# Patient Record
Sex: Female | Born: 1995 | Race: Black or African American | Hispanic: No | Marital: Married | Smoking: Never smoker
Health system: Southern US, Community
[De-identification: ages and names within clinical notes are randomized; demographics above are authoritative.]

---

## 2016-07-15 ENCOUNTER — Ambulatory Visit (INDEPENDENT_AMBULATORY_CARE_PROVIDER_SITE_OTHER): Payer: No Typology Code available for payment source | Admitting: Family Medicine

## 2016-07-15 VITALS — BP 122/72 | HR 71 | Temp 98.8°F | Resp 17 | Ht 67.0 in | Wt 152.0 lb

## 2016-07-15 DIAGNOSIS — R059 Cough, unspecified: Secondary | ICD-10-CM

## 2016-07-15 DIAGNOSIS — R05 Cough: Secondary | ICD-10-CM

## 2016-07-15 MED ORDER — GUAIFENESIN ER 1200 MG PO TB12: 1.0000 | ORAL_TABLET | Freq: Two times a day (BID) | ORAL | 1 refills | Status: DC | PRN

## 2016-07-15 MED ORDER — BENZONATATE 100 MG PO CAPS: 100.0000 mg | ORAL_CAPSULE | Freq: Three times a day (TID) | ORAL | 0 refills | Status: DC | PRN

## 2016-07-15 NOTE — Progress Notes (Signed)
   Patient ID: Katrina Mcdonald, female    DOB: 10-24-1995, 20 y.o.   MRN: [id number]  PCP: No primary care provider on file.  Chief Complaint  Patient presents with  . URI  . Headache    Subjective:   HPI 20 year old female presents for evaluation of headache and upper respiratory symptoms times 2.5 weeks. [facility] is [facility]. Reports 2.5 weeks ago she felt really ill and developed a sore throat, cough, nasal congestion, fever, and headache.  Reports almost complete resolution of symptoms until 2 days ago she began feeling fatigue and began coughing with some chest tenderness. She hasn't taken any medication and feels her cough is most worrisome symptom. Denies any recent fever, nausea, or vomiting.  Social History   Social History  . Marital status: Married    Spouse name: N/A  . Number of children: N/A  . Years of education: N/A   Occupational History  . Not on file.   Social History Main Topics  . Smoking status: Never Smoker  . Smokeless tobacco: Never Used  . Alcohol use No  . Drug use: No  . Sexual activity: No   Other Topics Concern  . Not on file   Social History Narrative  . No narrative on file   No family history on file.  Review of Systems See HPI There are no active problems to display for this patient.  Prior to Admission medications   Not on File   Allergies not on file     Objective:  Physical Exam  Constitutional: She is oriented to person, place, and time. She appears well-developed and well-nourished.  HENT:  Head: Normocephalic and atraumatic.  Right Ear: External ear normal.  Left Ear: External ear normal.  Nose: Mucosal edema and rhinorrhea present.  Mouth/Throat: Oropharynx is clear and moist.  Cardiovascular: Normal rate, regular rhythm, normal heart sounds and intact distal pulses.   Pulmonary/Chest: Effort normal and breath sounds normal.  Neurological: She is alert and oriented to person, place, and time.  Skin: Skin is warm and dry.    Psychiatric: She has a normal mood and affect. Her behavior is normal. Judgment and thought content normal.     Vitals:   07/15/16 1505  BP: 122/72  Pulse: 71  Resp: 17  Temp: 98.8 F (37.1 C)      Assessment & Plan:  Cough Plan: Start Guaifenesin (Mucinex) 1200 mg twice daily as needed to thin secretions. Start  Benzonatate (Tessalon) 100 mg, up to 3 times daily as needed for cough.  Will consider antibiotic treatment if symptoms worsen or do not improve. Patient instructed to update if condition worsens.  Godfrey PickKimberly S. Tiburcio PeaHarris, MSN, FNP-C [facility]

## 2016-07-15 NOTE — Patient Instructions (Signed)
     IF you received an x-ray today, you will receive an invoice from [facility]. Please contact [facility] at [phone] with questions or concerns regarding your invoice.   IF you received labwork today, you will receive an invoice from [facility]. Please contact [facility] at [phone] with questions or concerns regarding your invoice.   Our billing staff will not be able to assist you with questions regarding bills from these companies.  You will be contacted with the lab results as soon as they are available. The fastest way to get your results is to activate your My Chart account. Instructions are located on the last page of this paperwork. If you have not heard from us regarding the results in 2 weeks, please contact this office.      

## 2018-09-28 ENCOUNTER — Other Ambulatory Visit: Payer: Self-pay | Admitting: Obstetrics and Gynecology

## 2018-10-03 ENCOUNTER — Other Ambulatory Visit: Payer: Self-pay | Admitting: Obstetrics and Gynecology

## 2018-10-03 DIAGNOSIS — N63 Unspecified lump in unspecified breast: Secondary | ICD-10-CM

## 2018-10-09 ENCOUNTER — Ambulatory Visit
Admission: RE | Admit: 2018-10-09 | Discharge: 2018-10-09 | Disposition: A | Discharge status: Home / Self Care | Payer: PRIVATE HEALTH INSURANCE | Source: Ambulatory Visit | Attending: Obstetrics and Gynecology | Admitting: Obstetrics and Gynecology

## 2018-10-09 ENCOUNTER — Other Ambulatory Visit: Payer: Self-pay | Admitting: Obstetrics and Gynecology

## 2018-10-09 DIAGNOSIS — N63 Unspecified lump in unspecified breast: Secondary | ICD-10-CM

## 2018-10-10 ENCOUNTER — Other Ambulatory Visit: Payer: Self-pay | Admitting: Obstetrics and Gynecology

## 2018-10-10 DIAGNOSIS — Z803 Family history of malignant neoplasm of breast: Secondary | ICD-10-CM

## 2018-10-16 ENCOUNTER — Ambulatory Visit
Admission: RE | Admit: 2018-10-16 | Discharge: 2018-10-16 | Disposition: A | Discharge status: Home / Self Care | Payer: PRIVATE HEALTH INSURANCE | Source: Ambulatory Visit | Attending: Obstetrics and Gynecology | Admitting: Obstetrics and Gynecology

## 2018-10-16 DIAGNOSIS — Z803 Family history of malignant neoplasm of breast: Secondary | ICD-10-CM

## 2019-04-10 ENCOUNTER — Other Ambulatory Visit: Payer: Self-pay | Admitting: Obstetrics and Gynecology

## 2019-04-10 ENCOUNTER — Ambulatory Visit
Admission: RE | Admit: 2019-04-10 | Discharge: 2019-04-10 | Disposition: A | Discharge status: Home / Self Care | Payer: PRIVATE HEALTH INSURANCE | Source: Ambulatory Visit | Attending: Obstetrics and Gynecology | Admitting: Obstetrics and Gynecology

## 2019-04-10 ENCOUNTER — Other Ambulatory Visit: Payer: Self-pay

## 2019-04-10 DIAGNOSIS — N63 Unspecified lump in unspecified breast: Secondary | ICD-10-CM

## 2019-05-30 ENCOUNTER — Ambulatory Visit
Admission: EM | Admit: 2019-05-30 | Discharge: 2019-05-30 | Disposition: A | Discharge status: Home / Self Care | Payer: 59 | Attending: Emergency Medicine | Admitting: Emergency Medicine

## 2019-05-30 ENCOUNTER — Other Ambulatory Visit: Payer: Self-pay

## 2019-05-30 DIAGNOSIS — J029 Acute pharyngitis, unspecified: Secondary | ICD-10-CM | POA: Insufficient documentation

## 2019-05-30 DIAGNOSIS — Z1159 Encounter for screening for other viral diseases: Secondary | ICD-10-CM | POA: Diagnosis not present

## 2019-05-30 DIAGNOSIS — R509 Fever, unspecified: Secondary | ICD-10-CM | POA: Diagnosis not present

## 2019-05-30 LAB — POCT RAPID STREP A (OFFICE): Rapid Strep A Screen: NEGATIVE

## 2019-05-30 MED ORDER — LIDOCAINE VISCOUS HCL 2 % MT SOLN: 15.0000 mL | OROMUCOSAL | 0 refills | Status: DC | PRN

## 2019-05-30 MED ORDER — ACETAMINOPHEN 325 MG PO TABS
650.0000 mg | ORAL_TABLET | Freq: Once | ORAL | Status: AC
  Administered 2019-05-30: 650 mg via ORAL

## 2019-05-30 MED ORDER — PENICILLIN V POTASSIUM 500 MG PO TABS: 500.0000 mg | ORAL_TABLET | Freq: Two times a day (BID) | ORAL | 0 refills | Status: AC

## 2019-05-30 NOTE — Discharge Instructions (Addendum)
Take antibiotics as prescribed. You may gargle/spit lidocaine for additional throat relief. Return for worsening throat pain, chest pain, fever, shortness of breath.  Your COVID test is pending: Is important you quarantine at home until your results are back. You may take OTC Tylenol for fever and myalgias.  It is important to drink plenty of water throughout the day to stay hydrated. If you test positive and later develop severe fever, cough, or shortness of breath, it is recommended that you go to the ER for further evaluation.

## 2019-05-30 NOTE — ED Provider Notes (Signed)
[facility]    CSN: [id number] Arrival date & time: 05/30/19  0840      History   Chief Complaint Chief Complaint  Patient presents with  . Sore Throat    HPI Katrina Mcdonald is a 23 y.o. female presenting for sore throat, dyne aphasia, dysphasia since yesterday.  Patient also endorsing fever, tonsillar hypertrophy, exudate.  Patient denies known sick contacts, cough, shortness of breath, chest pain.  Patient has been able to work from home.   History reviewed. No pertinent past medical history.  There are no active problems to display for this patient.   History reviewed. No pertinent surgical history.  OB History   No obstetric history on file.      Home Medications    Prior to Admission medications   Medication Sig Start Date End Date Taking? Authorizing Provider  Multiple Vitamin (MULTIVITAMIN WITH MINERALS) TABS tablet Take 1 tablet by mouth daily.   Yes [provider]  lidocaine (XYLOCAINE) 2 % solution Use as directed 15 mLs in the mouth or throat as needed for mouth pain. 05/30/19   Hall-Potvin, [place], PA-C  penicillin v potassium (VEETID) 500 MG tablet Take 1 tablet (500 mg total) by mouth 2 (two) times daily after a meal for 10 days. 05/30/19 06/09/19  Hall-Potvin, [place], PA-C    Family History Family History  Problem Relation Age of Onset  . Breast cancer Mother   . Breast cancer Paternal Grandmother     Social History Social History   Tobacco Use  . Smoking status: Never Smoker  . Smokeless tobacco: Never Used  Substance Use Topics  . Alcohol use: Yes    Comment: occasionally  . Drug use: No     Allergies   Patient has no known allergies.   Review of Systems Review of Systems  Constitutional: Positive for fever. Negative for fatigue.  HENT: Positive for sore throat and trouble swallowing. Negative for ear pain, sinus pain and voice change.   Eyes: Negative for pain, redness and visual disturbance.   Respiratory: Negative for cough and shortness of breath.   Cardiovascular: Negative for chest pain and palpitations.  Gastrointestinal: Negative for abdominal pain, diarrhea and vomiting.  Musculoskeletal: Negative for arthralgias and myalgias.  Skin: Negative for rash and wound.  Neurological: Negative for syncope and headaches.     Physical Exam Triage Vital Signs ED Triage Vitals  Enc Vitals Group     BP 05/30/19 0848 116/73     Pulse Rate 05/30/19 0848 (!) 117     Resp 05/30/19 0848 16     Temp 05/30/19 0848 (!) 102.1 F (38.9 C)     Temp Source 05/30/19 0848 Oral     SpO2 05/30/19 0848 97 %     Weight --      Height --      Head Circumference --      Peak Flow --      Pain Score 05/30/19 0851 10     Pain Loc --      Pain Edu? --      Excl. in [place]? --    No data found.  Updated Vital Signs BP 116/73 (BP Location: Left Arm)   Pulse (!) 117   Temp (!) 102.1 F (38.9 C) (Oral)   Resp 16   LMP 05/28/2019   SpO2 97%    Physical Exam Constitutional:      General: She is not in acute distress.    Appearance: She  is well-developed and normal weight. She is not ill-appearing.  HENT:     Head: Normocephalic and atraumatic.     Jaw: There is normal jaw occlusion. No tenderness or pain on movement.     Right Ear: Hearing, tympanic membrane, ear canal and external ear normal. No tenderness. No mastoid tenderness.     Left Ear: Hearing, tympanic membrane, ear canal and external ear normal. No tenderness. No mastoid tenderness.     Nose: No nasal deformity, septal deviation or nasal tenderness.     Right Turbinates: Not swollen or pale.     Left Turbinates: Not swollen or pale.     Right Sinus: No maxillary sinus tenderness or frontal sinus tenderness.     Left Sinus: No maxillary sinus tenderness or frontal sinus tenderness.     Mouth/Throat:     Lips: Pink. No lesions.     Mouth: Mucous membranes are moist. No injury.     Pharynx: Oropharynx is clear. Uvula midline.  No posterior oropharyngeal erythema or uvula swelling.     Tonsils: Tonsillar exudate present. No tonsillar abscesses. 3+ on the right. 2+ on the left.  Eyes:     General: No scleral icterus.    Conjunctiva/sclera: Conjunctivae normal.     Pupils: Pupils are equal, round, and reactive to light.  Neck:     Musculoskeletal: Normal range of motion and neck supple. No muscular tenderness.     Comments: Bilateral anterior cervical chain LAD that is tender Cardiovascular:     Rate and Rhythm: Regular rhythm. Tachycardia present.     Heart sounds: No murmur. No gallop.      Comments: HR per provider: 107 bpm Pulmonary:     Effort: Pulmonary effort is normal. No respiratory distress.     Breath sounds: No wheezing or rales.  Lymphadenopathy:     Cervical: Cervical adenopathy present.  Skin:    Capillary Refill: Capillary refill takes less than 2 seconds.     Coloration: Skin is not jaundiced.     Findings: No erythema or rash.  Neurological:     General: No focal deficit present.     Mental Status: She is alert and oriented to person, place, and time.  Psychiatric:        Mood and Affect: Mood normal.        Behavior: Behavior normal.      UC Treatments / Results  Labs (all labs ordered are listed, but only abnormal results are displayed) Labs Reviewed  NOVEL CORONAVIRUS, NAA  CULTURE, GROUP A STREP [facility])  POCT RAPID STREP A (OFFICE)    EKG   Radiology No results found.  Procedures Procedures (including critical care time)  Medications Ordered in UC Medications  acetaminophen (TYLENOL) tablet 650 mg (650 mg Oral Given 05/30/19 0908)    Initial Impression / Assessment and Plan / UC Course  I have reviewed the triage vital signs and the nursing notes.  Pertinent labs & imaging results that were available during my care of the patient were reviewed by me and considered in my medical decision making (see chart for details).     1.  Sore throat Rapid strep done in  office, reviewed by me: Negative.  Culture pending.  Centor score 4.  Due to patient's fever, tachycardia, tonsillar exudate and hypertrophy will treat empirically for streptococcal infection/tonsillitis with penicillin.  Return precautions discussed, patient verbalized understanding and is agreeable to plan. Final Clinical Impressions(s) / UC Diagnoses   Final diagnoses:  Sore throat     Discharge Instructions     Take antibiotics as prescribed. You may gargle/spit lidocaine for additional throat relief. Return for worsening throat pain, chest pain, fever, shortness of breath.  Your COVID test is pending: Is important you quarantine at home until your results are back. You may take OTC Tylenol for fever and myalgias.  It is important to drink plenty of water throughout the day to stay hydrated. If you test positive and later develop severe fever, cough, or shortness of breath, it is recommended that you go to the ER for further evaluation.    ED Prescriptions    Medication Sig Dispense Auth. Provider   lidocaine (XYLOCAINE) 2 % solution Use as directed 15 mLs in the mouth or throat as needed for mouth pain. 100 mL Hall-Potvin, [place], PA-C   penicillin v potassium (VEETID) 500 MG tablet Take 1 tablet (500 mg total) by mouth 2 (two) times daily after a meal for 10 days. 20 tablet Hall-Potvin, [place], PA-C     Controlled Substance Prescriptions [place] Controlled Substance Registry consulted? Not Applicable   Shea EvansHall-Potvin, , [place]-C 05/30/19 1019

## 2019-05-30 NOTE — ED Triage Notes (Signed)
Pt presents to UC w/ c/o sore throat and fever since yesterday. Pt reports pain when swallowing. Pt has white patches and redness on tonsils.

## 2019-06-01 LAB — CULTURE, GROUP A STREP (THRC)

## 2019-06-01 LAB — NOVEL CORONAVIRUS, NAA: SARS-CoV-2, NAA: NOT DETECTED

## 2019-06-10 ENCOUNTER — Telehealth: Payer: 59 | Admitting: Physician Assistant

## 2019-06-10 DIAGNOSIS — J029 Acute pharyngitis, unspecified: Secondary | ICD-10-CM | POA: Diagnosis not present

## 2019-06-10 MED ORDER — AMOXICILLIN 500 MG PO CAPS: 500.0000 mg | ORAL_CAPSULE | Freq: Two times a day (BID) | ORAL | 0 refills | Status: DC

## 2019-06-10 NOTE — Progress Notes (Signed)
We are sorry that you are not feeling well.  Here is how we plan to help!  Based on what you have shared with me it is likely that you have strep pharyngitis.  Strep pharyngitis is inflammation and infection in the back of the throat.  This is an infection cause by bacteria and is treated with antibiotics.  I have prescribed Amoxicillin 500 mg twice a day for 10 days. For throat pain, we recommend over the counter oral pain relief medications such as acetaminophen or aspirin, or anti-inflammatory medications such as ibuprofen or naproxen sodium. Topical treatments such as oral throat lozenges or sprays may be used as needed. Strep infections are not as easily transmitted as other respiratory infections, however we still recommend that you avoid close contact with loved ones, especially the very [place] and elderly.  Remember to wash your hands thoroughly throughout the day as this is the number one way to prevent the spread of infection and wipe down door knobs and counters with disinfectant.   Home Care:  Only take medications as instructed by your medical team.  Complete the entire course of an antibiotic.  Do not take these medications with alcohol.  A steam or ultrasonic humidifier can help congestion.  You can place a towel over your head and breathe in the steam from hot water coming from a faucet.  Avoid close contacts especially the very [place] and the elderly.  Cover your mouth when you cough or sneeze.  Always remember to wash your hands.  Get Help Right Away If:  You develop worsening fever or sinus pain.  You develop a severe head ache or visual changes.  Your symptoms persist after you have completed your treatment plan.  Make sure you  Understand these instructions.  Will watch your condition.  Will get help right away if you are not doing well or get worse.  Your e-visit answers were reviewed by a board certified advanced clinical practitioner to complete your  personal care plan.  Depending on the condition, your plan could have included both over the counter or prescription medications.  If there is a problem please reply  once you have received a response from your provider.  Your safety is important to us.  If you have drug allergies check your prescription carefully.    You can use MyChart to ask questions about today's visit, request a non-urgent call back, or ask for a work or school excuse for 24 hours related to this e-Visit. If it has been greater than 24 hours you will need to follow up with your provider, or enter a new e-Visit to address those concerns.  You will get an e-mail in the next two days asking about your experience.  I hope that your e-visit has been valuable and will speed your recovery. Thank you for using e-visits.  Greater than 5 minutes, yet less than 10 minutes of time have been spent researching, coordinating and implementing care for this patient today.   

## 2019-06-23 ENCOUNTER — Other Ambulatory Visit: Payer: Self-pay

## 2019-06-23 ENCOUNTER — Ambulatory Visit
Admission: EM | Admit: 2019-06-23 | Discharge: 2019-06-23 | Disposition: A | Discharge status: Home / Self Care | Payer: PRIVATE HEALTH INSURANCE | Attending: Emergency Medicine | Admitting: Emergency Medicine

## 2019-06-23 DIAGNOSIS — J3501 Chronic tonsillitis: Secondary | ICD-10-CM

## 2019-06-23 MED ORDER — AMOXICILLIN 500 MG PO CAPS: 500.0000 mg | ORAL_CAPSULE | Freq: Two times a day (BID) | ORAL | 0 refills | Status: AC

## 2019-06-23 NOTE — ED Triage Notes (Signed)
Pt c/o recurrent sore throat for past few weeks. States has been on 2 rounds of antibotics. States sore throat with white patches since yesterday

## 2019-06-23 NOTE — ED Provider Notes (Signed)
[facility]    CSN: [id number] Arrival date & time: 06/23/19  0254      History   Chief Complaint Chief Complaint  Patient presents with  . Sore Throat    HPI Katrina Mcdonald is a 23 y.o. female presenting for 2-day course of sore throat.  Patient presents seen on 9/17 by me: Given penicillin for positive strep test with sore throat and fever.  Patient states she also did an E visit a few days after completing that course as "it came right back ".  Patient was given amoxicillin twice daily x10 days, which she took as prescribed.  Patient states about 2 days after finishing it she developed symptoms again.  Patient endorsing enlarged tonsils, exudate, odynophagia.  Patient not currently sexually active.   History reviewed. No pertinent past medical history.  There are no active problems to display for this patient.   History reviewed. No pertinent surgical history.  OB History   No obstetric history on file.      Home Medications    Prior to Admission medications   Medication Sig Start Date End Date Taking? Authorizing Provider  amoxicillin (AMOXIL) 500 MG capsule Take 1 capsule (500 mg total) by mouth 2 (two) times daily for 10 days. 06/23/19 07/03/19  Hall-Potvin, [place], PA-C  Multiple Vitamin (MULTIVITAMIN WITH MINERALS) TABS tablet Take 1 tablet by mouth daily.    [provider]    Family History Family History  Problem Relation Age of Onset  . Breast cancer Mother   . Breast cancer Paternal Grandmother     Social History Social History   Tobacco Use  . Smoking status: Never Smoker  . Smokeless tobacco: Never Used  Substance Use Topics  . Alcohol use: Yes    Comment: occasionally  . Drug use: No     Allergies   Patient has no known allergies.   Review of Systems Review of Systems  Constitutional: Negative for fatigue and fever.  HENT: Positive for sore throat and trouble swallowing. Negative for congestion, dental  problem, ear pain, facial swelling, hearing loss, sinus pain and voice change.   Eyes: Negative for photophobia, pain and visual disturbance.  Respiratory: Negative for cough and shortness of breath.   Cardiovascular: Negative for chest pain and palpitations.  Gastrointestinal: Negative for diarrhea and vomiting.  Musculoskeletal: Negative for arthralgias and myalgias.  Neurological: Negative for dizziness and headaches.     Physical Exam Triage Vital Signs ED Triage Vitals  Enc Vitals Group     BP      Pulse      Resp      Temp      Temp src      SpO2      Weight      Height      Head Circumference      Peak Flow      Pain Score      Pain Loc      Pain Edu?      Excl. in [place]?    No data found.  Updated Vital Signs BP 122/79 (BP Location: Left Arm)   Pulse (!) 106   Temp 98.4 F (36.9 C) (Oral)   Resp 18   LMP 05/28/2019   SpO2 97%   Visual Acuity Right Eye Distance:   Left Eye Distance:   Bilateral Distance:    Right Eye Near:   Left Eye Near:    Bilateral Near:     Physical  Exam Constitutional:      General: She is not in acute distress.    Appearance: She is normal weight. She is not ill-appearing.  HENT:     Head: Normocephalic and atraumatic.     Jaw: There is normal jaw occlusion. No tenderness or pain on movement.     Right Ear: Hearing, tympanic membrane, ear canal and external ear normal. No tenderness. No mastoid tenderness.     Left Ear: Hearing, tympanic membrane, ear canal and external ear normal. No tenderness. No mastoid tenderness.     Nose: No nasal deformity, septal deviation or nasal tenderness.     Right Turbinates: Not swollen or pale.     Left Turbinates: Not swollen or pale.     Right Sinus: No maxillary sinus tenderness or frontal sinus tenderness.     Left Sinus: No maxillary sinus tenderness or frontal sinus tenderness.     Mouth/Throat:     Lips: Pink. No lesions.     Mouth: Mucous membranes are moist. No injury.     Pharynx:  Oropharynx is clear. Uvula midline. No posterior oropharyngeal erythema or uvula swelling.     Tonsils: Tonsillar exudate present. No tonsillar abscesses. 3+ on the right. 3+ on the left.  Eyes:     Conjunctiva/sclera: Conjunctivae normal.  Neck:     Musculoskeletal: Normal range of motion and neck supple. No muscular tenderness.  Cardiovascular:     Rate and Rhythm: Regular rhythm. Tachycardia present.     Heart sounds: No murmur. No gallop.      Comments: Patient's heart rate between 95-107 bpm during time visit Pulmonary:     Effort: Pulmonary effort is normal. No respiratory distress.     Breath sounds: No wheezing.  Lymphadenopathy:     Cervical: Cervical adenopathy present.  Skin:    General: Skin is warm.     Capillary Refill: Capillary refill takes less than 2 seconds.     Findings: No erythema or rash.  Neurological:     General: No focal deficit present.     Mental Status: She is alert and oriented to person, place, and time.      UC Treatments / Results  Labs (all labs ordered are listed, but only abnormal results are displayed) Labs Reviewed - No data to display  EKG   Radiology No results found.  Procedures Procedures (including critical care time)  Medications Ordered in UC Medications - No data to display  Initial Impression / Assessment and Plan / UC Course  I have reviewed the triage vital signs and the nursing notes.  Pertinent labs & imaging results that were available during my care of the patient were reviewed by me and considered in my medical decision making (see chart for details).     1.  Chronic tonsillitis Patient now being seen for third time within 1 month for tonsillar hypertrophy, exudate, odynophagia.  Patient has completed 2 courses of antibiotics.  Discussed that patient should be further evaluated by ENT for additional work-up/possible tonsillectomy.  Patient verbalized understanding.  Given previous positive strep done in this  office, with previous evaluation by this provider, prescription for amoxicillin was sent.  Discussed the patient should fill this only if she becomes febrile due to chronic antibiotic use, with expected short-term follow-up by ENT.  Return precautions discussed, patient verbalized understanding and is agreeable to plan. Final Clinical Impressions(s) / UC Diagnoses   Final diagnoses:  Chronic tonsillitis     Discharge Instructions  You may start taking amoxicillin if you develop a fever. Important to call ENT on Monday/Tuesday when they open to schedule appointment for 1 week urgent care follow-up. Return for worsening pain, difficulty breathing, fever that does not reduce with Tylenol.    ED Prescriptions    Medication Sig Dispense Auth. Provider   amoxicillin (AMOXIL) 500 MG capsule Take 1 capsule (500 mg total) by mouth 2 (two) times daily for 10 days. 20 capsule Hall-Potvin, [place], PA-C     PDMP not reviewed this encounter.   Hall-Potvin, [place], [place] 06/23/19 1308

## 2019-06-23 NOTE — Discharge Instructions (Addendum)
You may start taking amoxicillin if you develop a fever. Important to call ENT on Monday/Tuesday when they open to schedule appointment for 1 week urgent care follow-up. Return for worsening pain, difficulty breathing, fever that does not reduce with Tylenol.

## 2019-06-24 ENCOUNTER — Telehealth: Payer: Self-pay | Admitting: Emergency Medicine

## 2019-06-24 NOTE — Telephone Encounter (Signed)
Checked in on patient, discussed medications, and encouraged return call with any continuing questions or concerns.    

## 2019-10-14 ENCOUNTER — Ambulatory Visit
Admission: RE | Admit: 2019-10-14 | Discharge: 2019-10-14 | Disposition: A | Discharge status: Home / Self Care | Payer: PRIVATE HEALTH INSURANCE | Source: Ambulatory Visit | Attending: Obstetrics and Gynecology | Admitting: Obstetrics and Gynecology

## 2019-10-14 ENCOUNTER — Other Ambulatory Visit: Payer: Self-pay

## 2019-10-14 ENCOUNTER — Ambulatory Visit
Admission: RE | Admit: 2019-10-14 | Discharge: 2019-10-14 | Disposition: A | Discharge status: Home / Self Care | Payer: 59 | Source: Ambulatory Visit | Attending: Obstetrics and Gynecology | Admitting: Obstetrics and Gynecology

## 2019-10-14 DIAGNOSIS — N63 Unspecified lump in unspecified breast: Secondary | ICD-10-CM

## 2020-09-03 IMAGING — MG DIGITAL DIAGNOSTIC BILAT W/ TOMO W/ CAD
8 series · 8 of 24 positions shown · non-contrast
Comparison: Previous exam(s).

CLINICAL DATA: Short-term interval follow-up of a probable benign
left breast mass and axillary lymph node originally seen on
ultrasound dated 10/09/2018. Strong family history of breast cancer.
The patient's mother was diagnosed with breast cancer at the age of
24.

EXAM:
DIGITAL DIAGNOSTIC BILATERAL MAMMOGRAM WITH CAD AND TOMO
ULTRASOUND LEFT BREAST

[L MLO synth-2D]
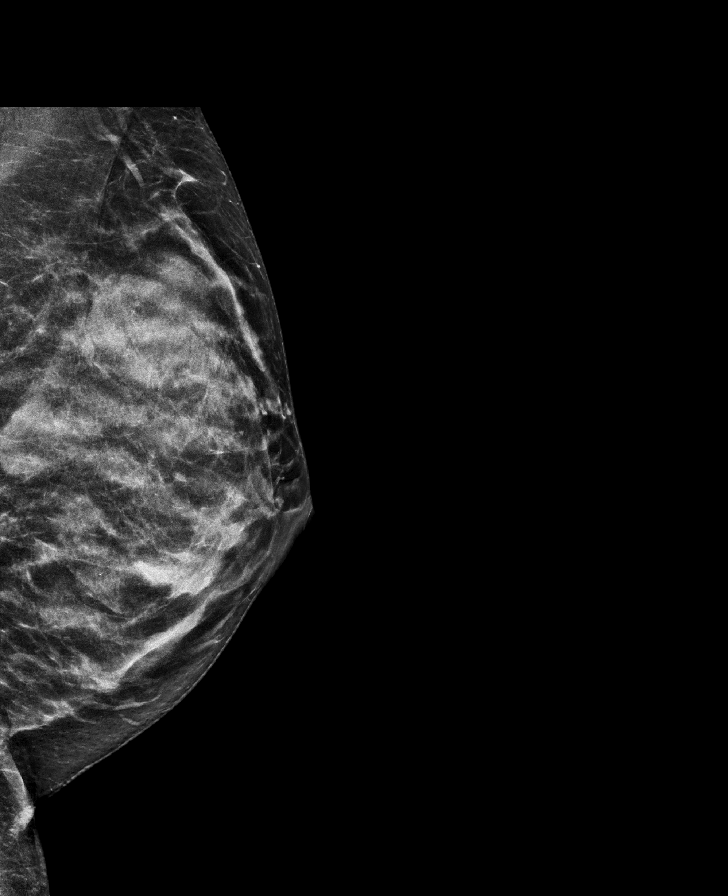

[R MLO synth-2D]
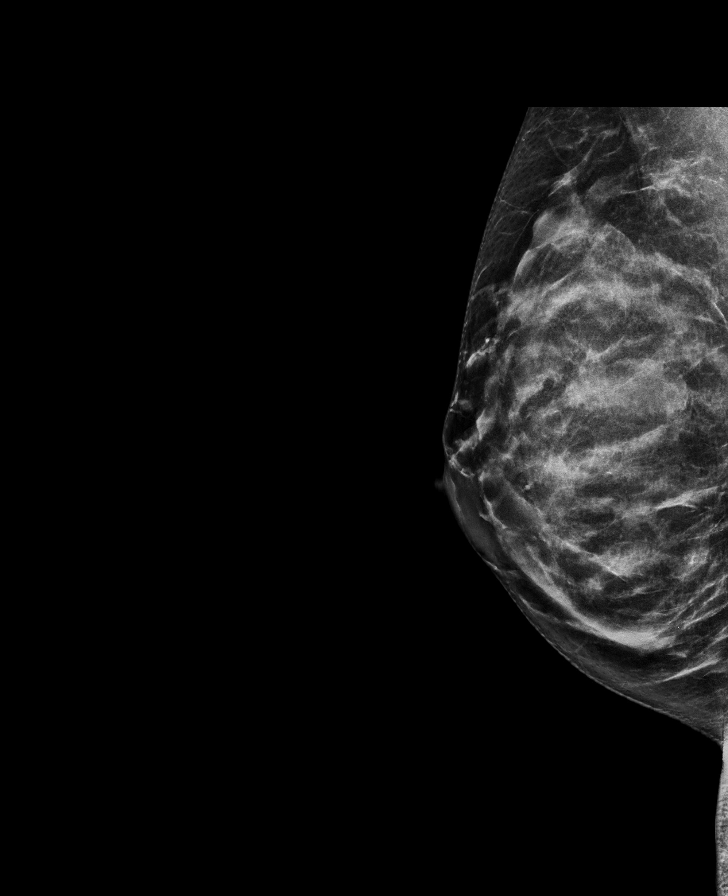

[L CC synth-2D]
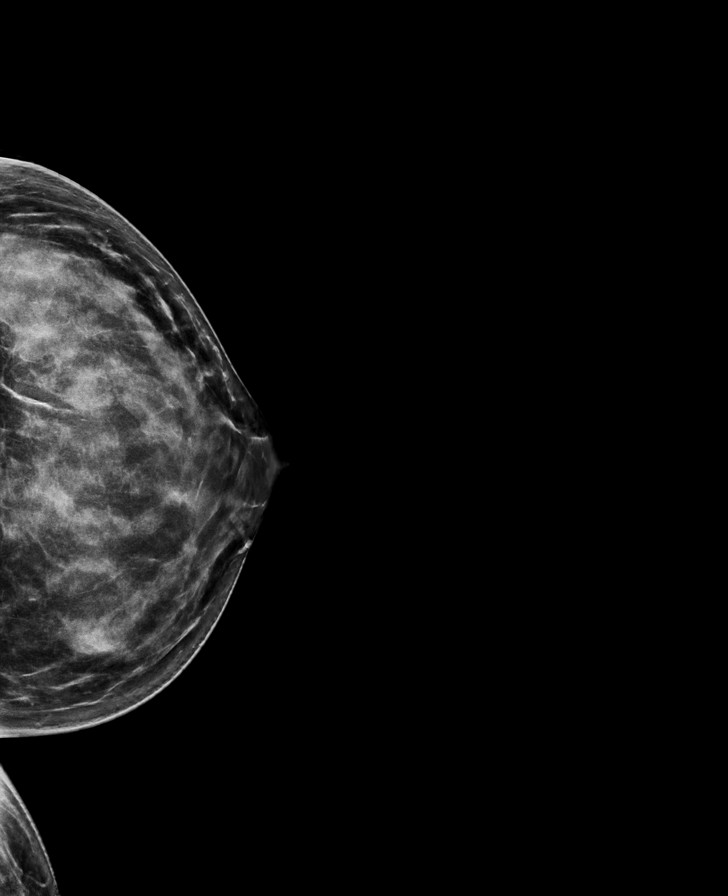

[R CC synth-2D]
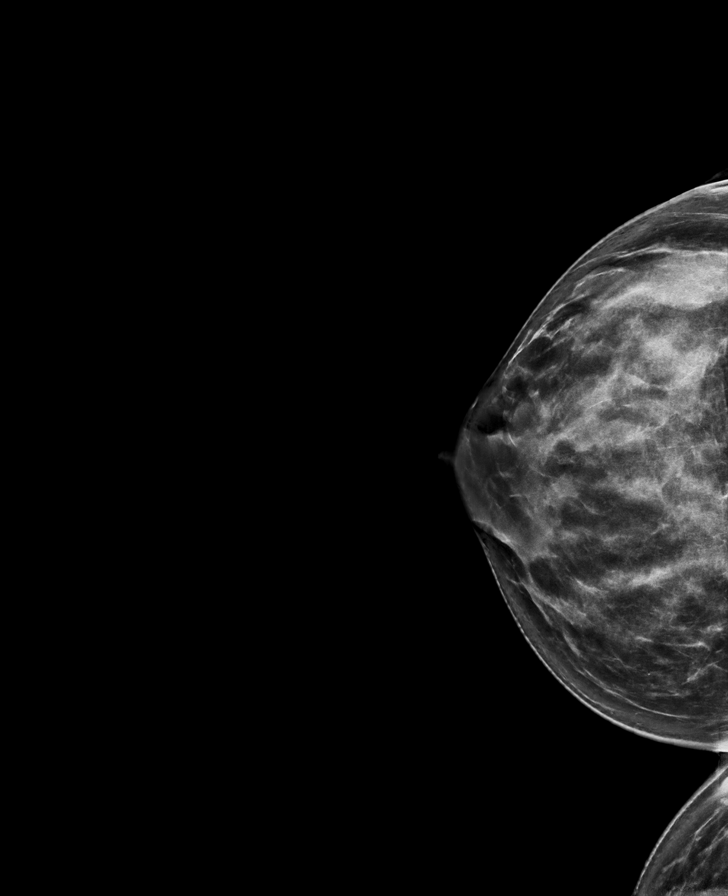

[R CC tomo · tomo slice 39/78.0]
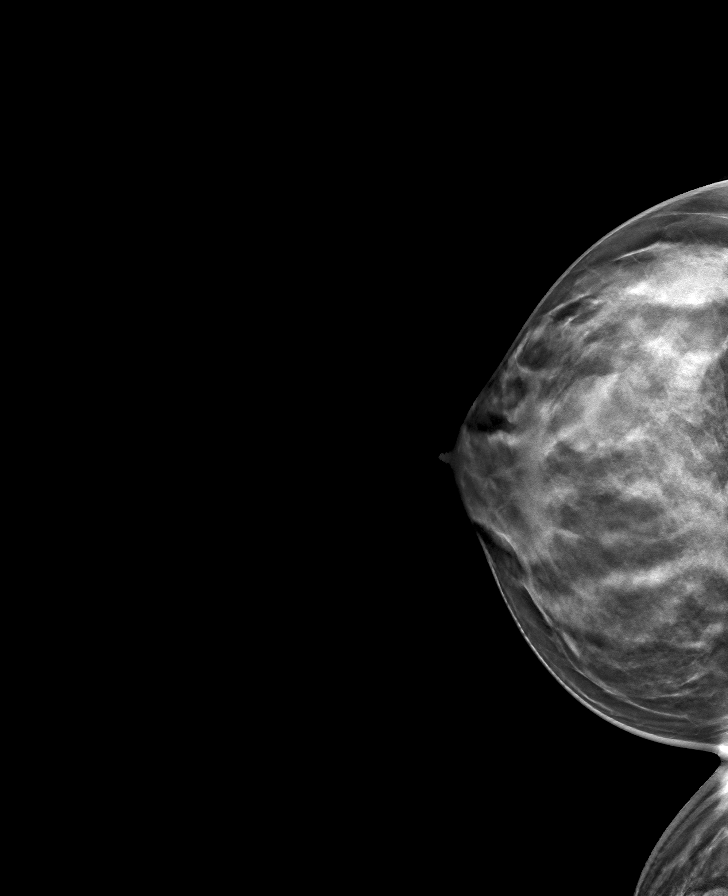

[R MLO tomo · tomo slice 34/67.0]
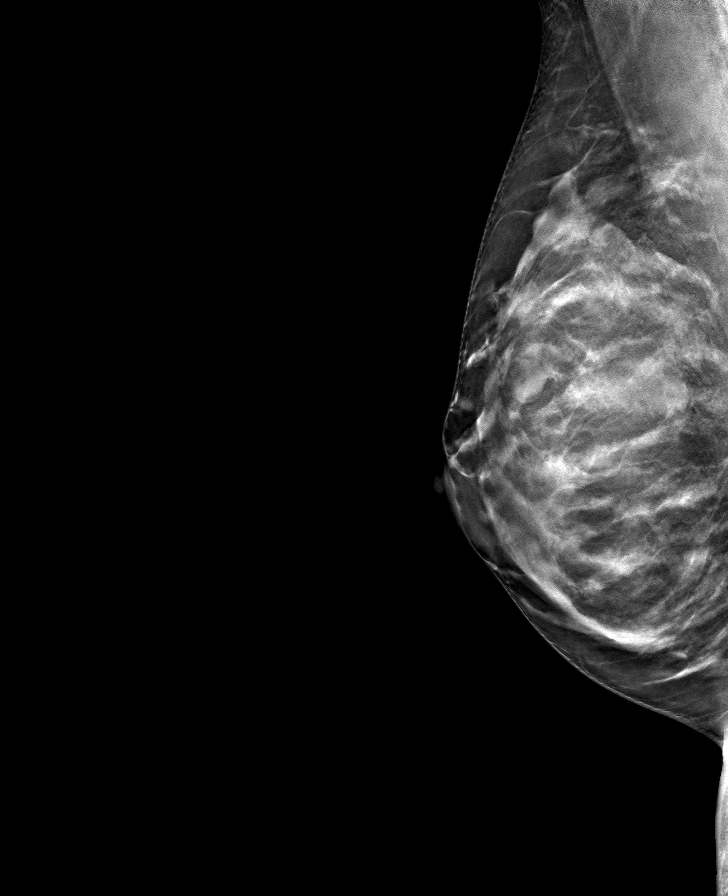

[L CC tomo · tomo slice 37/73.0]
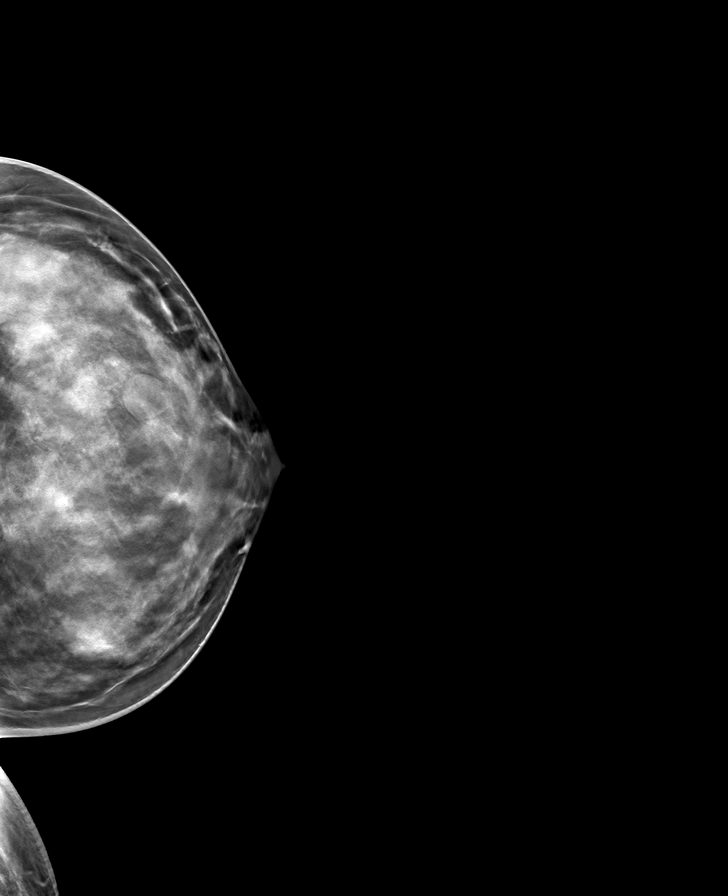

[L MLO tomo · tomo slice 32/63.0]
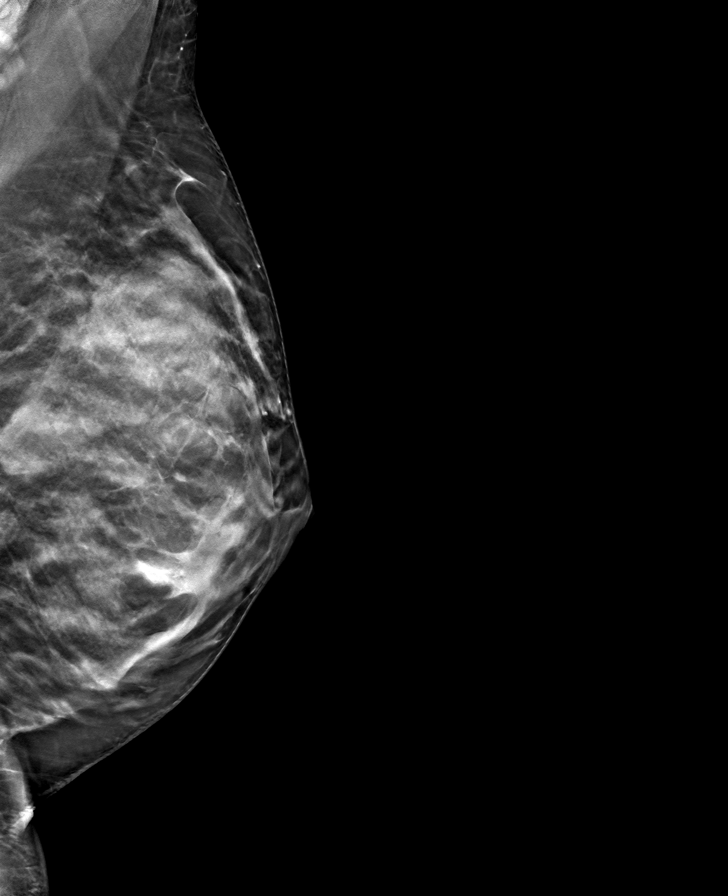

[8 of 24 positions shown; findings below may reference images not displayed]

ACR Breast Density Category d: The breast tissue is extremely dense,
which lowers the sensitivity of mammography.
FINDINGS: Circumscribed, bilateral masses identified in both breast. No
suspicious mass or malignant type microcalcifications identified in
either breast.

Mammographic images were processed with CAD.

Targeted ultrasound is performed, showing a well-circumscribed
hypoechoic mass in the left breast at 10 o'clock 4 cm from the
nipple measuring 2.8 x 1.1 x 2.4 cm. On the prior ultrasound dated
10/09/2018 it measured 2.8 x 1.0 x 2.6 cm. Sonographic evaluation of
the left axilla shows a lymph node with cortical thickness measuring
0.28 cm. On the prior exam dated [DATE] 8282 it measured 0.35 cm
IMPRESSION: Stable probable benign fibroadenoma in left axillary lymph node.

RECOMMENDATION:
Bilateral diagnostic mammogram and left breast ultrasound in 1 year
is recommended.

The American Cancer Society recommends annual MRI and mammography in
patients with an estimated lifetime risk of developing breast cancer
greater than 20 - 25%, or who are known or suspected to be positive
for the breast cancer gene.

I have discussed the findings and recommendations with the patient.
If applicable, a reminder letter will be sent to the patient
regarding the next appointment.

BI-RADS CATEGORY  3: Probably benign.
